# Patient Record
Sex: Male | Born: 1975 | Race: White | Hispanic: No | Marital: Married | Smoking: Never smoker
Health system: Southern US, Community
[De-identification: ages and names within clinical notes are randomized; demographics above are authoritative.]

## PROBLEM LIST (undated history)

## (undated) DIAGNOSIS — I10 Essential (primary) hypertension: Secondary | ICD-10-CM

---

## 2008-02-02 ENCOUNTER — Ambulatory Visit: Payer: Self-pay | Admitting: Family Medicine

## 2015-12-10 ENCOUNTER — Encounter: Payer: Self-pay | Admitting: *Deleted

## 2015-12-13 ENCOUNTER — Ambulatory Visit: Payer: Self-pay | Admitting: Cardiology

## 2016-09-27 ENCOUNTER — Ambulatory Visit
Admission: EM | Admit: 2016-09-27 | Discharge: 2016-09-27 | Disposition: A | Discharge status: Home / Self Care | Payer: 59 | Attending: Family Medicine | Admitting: Family Medicine

## 2016-09-27 ENCOUNTER — Encounter: Payer: Self-pay | Admitting: Emergency Medicine

## 2016-09-27 DIAGNOSIS — A084 Viral intestinal infection, unspecified: Secondary | ICD-10-CM

## 2016-09-27 LAB — CBC WITH DIFFERENTIAL/PLATELET
Basophils Absolute: 0.1 10*3/uL (ref 0–0.1)
Basophils Relative: 0 %
EOS PCT: 0 %
Eosinophils Absolute: 0 10*3/uL (ref 0–0.7)
HEMATOCRIT: 44.5 % (ref 40.0–52.0)
Hemoglobin: 15.7 g/dL (ref 13.0–18.0)
LYMPHS ABS: 0.4 10*3/uL — AB (ref 1.0–3.6)
LYMPHS PCT: 3 %
MCH: 32.2 pg (ref 26.0–34.0)
MCHC: 35.2 g/dL (ref 32.0–36.0)
MCV: 91.4 fL (ref 80.0–100.0)
MONO ABS: 0.4 10*3/uL (ref 0.2–1.0)
MONOS PCT: 3 %
NEUTROS ABS: 11.6 10*3/uL — AB (ref 1.4–6.5)
Neutrophils Relative %: 94 %
PLATELETS: 152 10*3/uL (ref 150–440)
RBC: 4.87 MIL/uL (ref 4.40–5.90)
RDW: 12.9 % (ref 11.5–14.5)
WBC: 12.4 10*3/uL — ABNORMAL HIGH (ref 3.8–10.6)

## 2016-09-27 LAB — COMPREHENSIVE METABOLIC PANEL
ALT: 42 U/L (ref 17–63)
AST: 38 U/L (ref 15–41)
Albumin: 4.5 g/dL (ref 3.5–5.0)
Alkaline Phosphatase: 64 U/L (ref 38–126)
Anion gap: 10 (ref 5–15)
BILIRUBIN TOTAL: 0.8 mg/dL (ref 0.3–1.2)
BUN: 11 mg/dL (ref 6–20)
CO2: 22 mmol/L (ref 22–32)
CREATININE: 1.17 mg/dL (ref 0.61–1.24)
Calcium: 9.3 mg/dL (ref 8.9–10.3)
Chloride: 101 mmol/L (ref 101–111)
Glucose, Bld: 136 mg/dL — ABNORMAL HIGH (ref 65–99)
POTASSIUM: 4 mmol/L (ref 3.5–5.1)
Sodium: 133 mmol/L — ABNORMAL LOW (ref 135–145)
TOTAL PROTEIN: 8.3 g/dL — AB (ref 6.5–8.1)

## 2016-09-27 MED ORDER — ONDANSETRON 8 MG PO TBDP: 8.0000 mg | ORAL_TABLET | Freq: Three times a day (TID) | ORAL | 0 refills | Status: AC | PRN

## 2016-09-27 MED ORDER — ONDANSETRON 8 MG PO TBDP
8.0000 mg | ORAL_TABLET | Freq: Once | ORAL | Status: AC
  Administered 2016-09-27: 8 mg via ORAL

## 2016-09-27 NOTE — ED Provider Notes (Signed)
[facility]    CSN: [id number] Arrival date & time: 09/27/16  [id number]     History   Chief Complaint Chief Complaint  Patient presents with  . Diarrhea  . Abdominal Pain  . Generalized Body Aches    HPI Craig Clarke is a 41 y.o. male.   The history is provided by the patient.  Diarrhea  Quality:  Watery Severity:  Moderate Onset quality:  Sudden Number of episodes:  5 Duration:  1 day Timing:  Constant Progression:  Worsening Relieved by:  Nothing Worsened by:  Nothing Associated symptoms: abdominal pain ("cramping", mild), headaches and myalgias   Associated symptoms: no arthralgias, no chills, no recent cough, no diaphoresis, no fever, no URI and no vomiting   Associated symptoms comment:  Nausea Risk factors: no recent antibiotic use, no sick contacts, no suspicious food intake and no travel to endemic areas     History reviewed. No pertinent past medical history.  There are no active problems to display for this patient.   History reviewed. No pertinent surgical history.     Home Medications    Prior to Admission medications   Medication Sig Start Date End Date Taking? Authorizing Provider  ondansetron (ZOFRAN ODT) 8 MG disintegrating tablet Take 1 tablet (8 mg total) by mouth every 8 (eight) hours as needed for nausea or vomiting. 09/27/16   Payton Mccallumonty, Lorina Duffner, MD    Family History History reviewed. No pertinent family history.  Social History Social History  Substance Use Topics  . Smoking status: Never Smoker  . Smokeless tobacco: Never Used  . Alcohol use Yes     Allergies   Patient has no known allergies.   Review of Systems Review of Systems  Constitutional: Negative for chills, diaphoresis and fever.  Gastrointestinal: Positive for abdominal pain ("cramping", mild) and diarrhea. Negative for vomiting.  Musculoskeletal: Positive for myalgias. Negative for arthralgias.  Neurological: Positive for headaches.     Physical  Exam Triage Vital Signs ED Triage Vitals [09/27/16 0901]  Enc Vitals Group     BP (!) 146/87     Pulse Rate (!) 121     Resp 16     Temp (!) 101.9 F (38.8 C)     Temp Source Oral     SpO2 97 %     Weight 215 lb (97.5 kg)     Height 5\' 7"  (1.702 m)     Head Circumference      Peak Flow      Pain Score 7     Pain Loc      Pain Edu?      Excl. in GC?    No data found.   Updated Vital Signs BP 139/85 (BP Location: Left Arm)   Pulse (!) 113   Temp (!) 102.4 F (39.1 C) (Oral)   Resp 16   Ht 5\' 7"  (1.702 m)   Wt 215 lb (97.5 kg)   SpO2 97%   BMI 33.67 kg/m   Visual Acuity Right Eye Distance:   Left Eye Distance:   Bilateral Distance:    Right Eye Near:   Left Eye Near:    Bilateral Near:     Physical Exam  Constitutional: He is oriented to person, place, and time. He appears well-developed and well-nourished. No distress.  HENT:  Head: Normocephalic and atraumatic.  Cardiovascular: Normal rate, regular rhythm, normal heart sounds and intact distal pulses.   No murmur heard. Pulmonary/Chest: Effort normal and breath sounds normal. No  respiratory distress. He has no wheezes. He has no rales.  Abdominal: Soft. Bowel sounds are normal. He exhibits no distension and no mass. There is tenderness (mild, diffuse, no rebound or guarding). There is no rebound and no guarding.  Neurological: He is alert and oriented to person, place, and time.  Skin: No rash noted. He is not diaphoretic.  Nursing note and vitals reviewed.    UC Treatments / Results  Labs (all labs ordered are listed, but only abnormal results are displayed) Labs Reviewed  CBC WITH DIFFERENTIAL/PLATELET - Abnormal; Notable for the following:       Result Value   WBC 12.4 (*)    Neutro Abs 11.6 (*)    Lymphs Abs 0.4 (*)    All other components within normal limits  COMPREHENSIVE METABOLIC PANEL - Abnormal; Notable for the following:    Sodium 133 (*)    Glucose, Bld 136 (*)    Total Protein 8.3  (*)    All other components within normal limits    EKG  EKG Interpretation None       Radiology No results found.  Procedures Procedures (including critical care time)  Medications Ordered in UC Medications  ondansetron (ZOFRAN-ODT) disintegrating tablet 8 mg (8 mg Oral Given 09/27/16 0932)     Initial Impression / Assessment and Plan / UC Course  I have reviewed the triage vital signs and the nursing notes.  Pertinent labs & imaging results that were available during my care of the patient were reviewed by me and considered in my medical decision making (see chart for details).       Final Clinical Impressions(s) / UC Diagnoses   Final diagnoses:  Viral gastroenteritis    New Prescriptions Discharge Medication List as of 09/27/2016 10:34 AM    START taking these medications   Details  ondansetron (ZOFRAN ODT) 8 MG disintegrating tablet Take 1 tablet (8 mg total) by mouth every 8 (eight) hours as needed for nausea or vomiting., Starting Wed 09/27/2016, Normal       1. Lab results and diagnosis reviewed with patient; patient given zofran 8mg  odt x1 with improvement of symptoms; tolerating po fluids prior to d/c 2. rx as per orders above; reviewed possible side effects, interactions, risks and benefits  3. Recommend supportive treatment with increased fluids, otc imodium 4. Follow-up prn if symptoms worsen or don't improve   Payton Mccallum, MD 09/27/16 1252

## 2016-09-27 NOTE — ED Notes (Signed)
Patient given ginger ale to drink.  Patient states that he does not want any medicine for his fever.

## 2016-09-27 NOTE — ED Triage Notes (Signed)
Patient c/o diarrhea that started yesterday.  Patient reports bodyaches and headaches.  Patient denies vomiting.  Patient reports mid abdominal stomach cramps.

## 2016-09-27 NOTE — Discharge Instructions (Signed)
Increase clear liquids next 24-48 hours (gatorade, pedialyte, broth, light soup, water, soda) Imodium AD as needed for diarrhea

## 2016-09-28 ENCOUNTER — Other Ambulatory Visit
Admission: RE | Admit: 2016-09-28 | Discharge: 2016-09-28 | Disposition: A | Discharge status: Home / Self Care | Payer: 59 | Source: Ambulatory Visit | Attending: Family Medicine | Admitting: Family Medicine

## 2016-09-28 DIAGNOSIS — K529 Noninfective gastroenteritis and colitis, unspecified: Secondary | ICD-10-CM | POA: Insufficient documentation

## 2016-09-28 LAB — GASTROINTESTINAL PANEL BY PCR, STOOL (REPLACES STOOL CULTURE)
ASTROVIRUS: NOT DETECTED
Adenovirus F40/41: NOT DETECTED
CAMPYLOBACTER SPECIES: DETECTED — AB
Cryptosporidium: NOT DETECTED
Cyclospora cayetanensis: NOT DETECTED
Entamoeba histolytica: NOT DETECTED
Enteroaggregative E coli (EAEC): NOT DETECTED
Enteropathogenic E coli (EPEC): NOT DETECTED
Enterotoxigenic E coli (ETEC): NOT DETECTED
Giardia lamblia: NOT DETECTED
Norovirus GI/GII: NOT DETECTED
PLESIMONAS SHIGELLOIDES: NOT DETECTED
ROTAVIRUS A: NOT DETECTED
SAPOVIRUS (I, II, IV, AND V): NOT DETECTED
SHIGA LIKE TOXIN PRODUCING E COLI (STEC): NOT DETECTED
Salmonella species: NOT DETECTED
Shigella/Enteroinvasive E coli (EIEC): NOT DETECTED
VIBRIO CHOLERAE: NOT DETECTED
Vibrio species: NOT DETECTED
Yersinia enterocolitica: NOT DETECTED

## 2016-09-29 ENCOUNTER — Telehealth: Payer: Self-pay | Admitting: Internal Medicine

## 2016-09-29 MED ORDER — CIPROFLOXACIN HCL 500 MG PO TABS: 500.0000 mg | ORAL_TABLET | Freq: Two times a day (BID) | ORAL | 0 refills | Status: AC

## 2016-09-29 NOTE — Telephone Encounter (Signed)
Received lab result: stool sample positive for campylobacter.  Will send rx for cipro to pharmacy of record, [facility] in [place].  Recheck for further evaluation if symptoms are not improving.  LM

## 2016-10-02 ENCOUNTER — Telehealth: Payer: Self-pay | Admitting: Emergency Medicine

## 2016-10-02 NOTE — Telephone Encounter (Signed)
The Communicable disease form faxed to [facility].

## 2016-10-02 NOTE — Telephone Encounter (Signed)
Patient notified that his stool specimen did come back positive for Campylobacter and that Dr. Dayton ScrapeMurray sent a prescription for Cipro to his pharmacy to treat this infection.  Patient states that he started that antibiotic on Friday and is feeling so much better.  Patient instructed to continue with the Cipro and to follow-up here or with his PCP if his symptoms return or worsen.  Patient verbalized understanding.

## 2020-12-26 ENCOUNTER — Ambulatory Visit (INDEPENDENT_AMBULATORY_CARE_PROVIDER_SITE_OTHER): Payer: BC Managed Care – PPO

## 2020-12-26 ENCOUNTER — Ambulatory Visit
Admission: EM | Admit: 2020-12-26 | Discharge: 2020-12-26 | Disposition: A | Discharge status: Home / Self Care | Payer: BC Managed Care – PPO | Attending: Emergency Medicine | Admitting: Emergency Medicine

## 2020-12-26 ENCOUNTER — Other Ambulatory Visit: Payer: Self-pay

## 2020-12-26 DIAGNOSIS — S2242XA Multiple fractures of ribs, left side, initial encounter for closed fracture: Secondary | ICD-10-CM | POA: Diagnosis present

## 2020-12-26 DIAGNOSIS — S301XXA Contusion of abdominal wall, initial encounter: Secondary | ICD-10-CM | POA: Diagnosis present

## 2020-12-26 DIAGNOSIS — R0781 Pleurodynia: Secondary | ICD-10-CM | POA: Diagnosis not present

## 2020-12-26 HISTORY — DX: Essential (primary) hypertension: I10

## 2020-12-26 LAB — POCT URINALYSIS DIP (DEVICE)
Bilirubin Urine: NEGATIVE
Glucose, UA: NEGATIVE mg/dL
Hgb urine dipstick: NEGATIVE
Ketones, ur: NEGATIVE mg/dL
Nitrite: NEGATIVE
Protein, ur: NEGATIVE mg/dL
Specific Gravity, Urine: 1.02 (ref 1.005–1.030)
Urobilinogen, UA: 0.2 mg/dL (ref 0.0–1.0)
pH: 6.5 (ref 5.0–8.0)

## 2020-12-26 MED ORDER — BACLOFEN 10 MG PO TABS: 10.0000 mg | ORAL_TABLET | Freq: Three times a day (TID) | ORAL | 0 refills | Status: AC

## 2020-12-26 MED ORDER — HYDROCODONE-ACETAMINOPHEN 5-325 MG PO TABS: 1.0000 | ORAL_TABLET | Freq: Four times a day (QID) | ORAL | 0 refills | Status: AC | PRN
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
## 2020-12-26 NOTE — Discharge Instructions (Addendum)
Use Tylenol and ibuprofen according to package instructions as needed for mild to moderate pain.  Use the Norco as needed for severe pain.  You may have 1 to 2 tablets every 6 hours.  This medication also contains Tylenol so keep track of how much Tylenol you are taking and do not exceed 4000 mg a day.  Use the baclofen every 8 hours as needed for muscle spasm.  Take 10 deep breaths per hour to keep your lungs well-inflated and prevent the development of pneumonia.  You can apply a lidocaine patch topically to the area of pain as well.  Return for reevaluation for new or worsening symptoms.
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
## 2023-02-18 IMAGING — CR DG RIBS W/ CHEST 3+V*L*
3 series · 3 of 3 positions shown · non-contrast
Comparison: None.

CLINICAL DATA: Left rib pain after injury last night.

EXAM:
LEFT RIBS AND CHEST - 3+ VIEW

[chest pa]
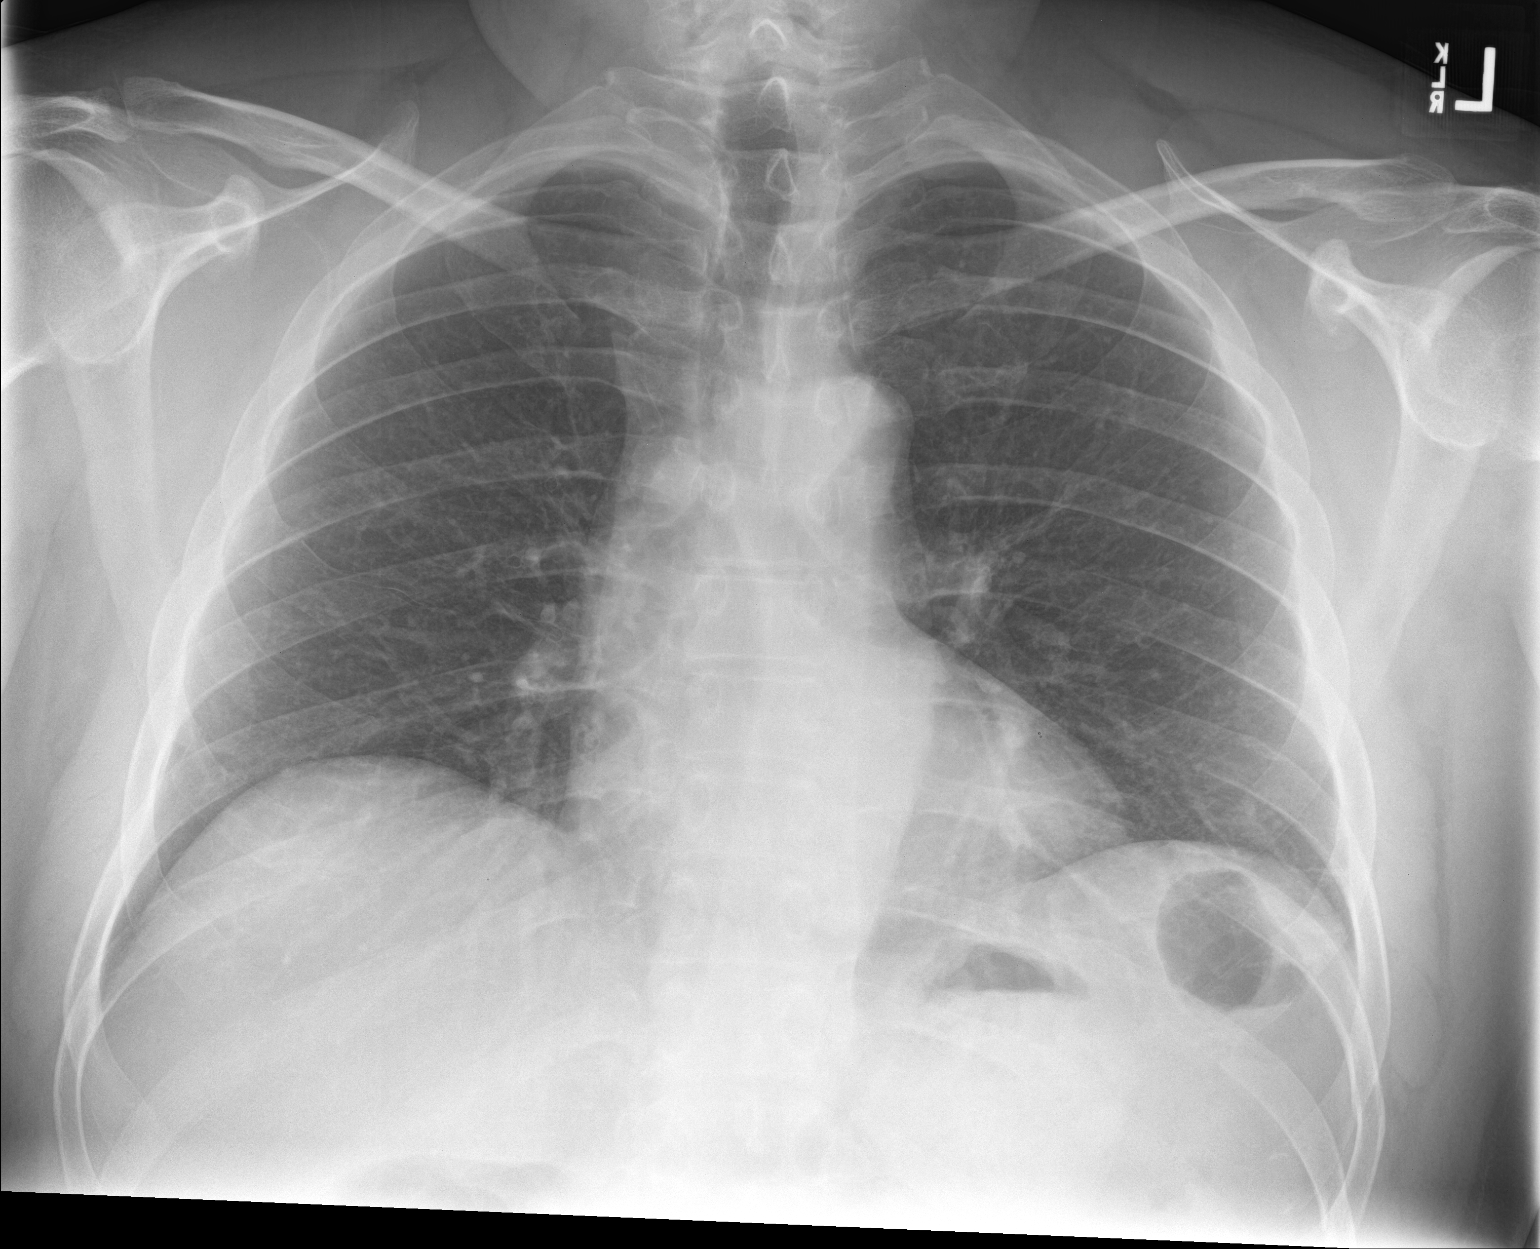

[rib pa]
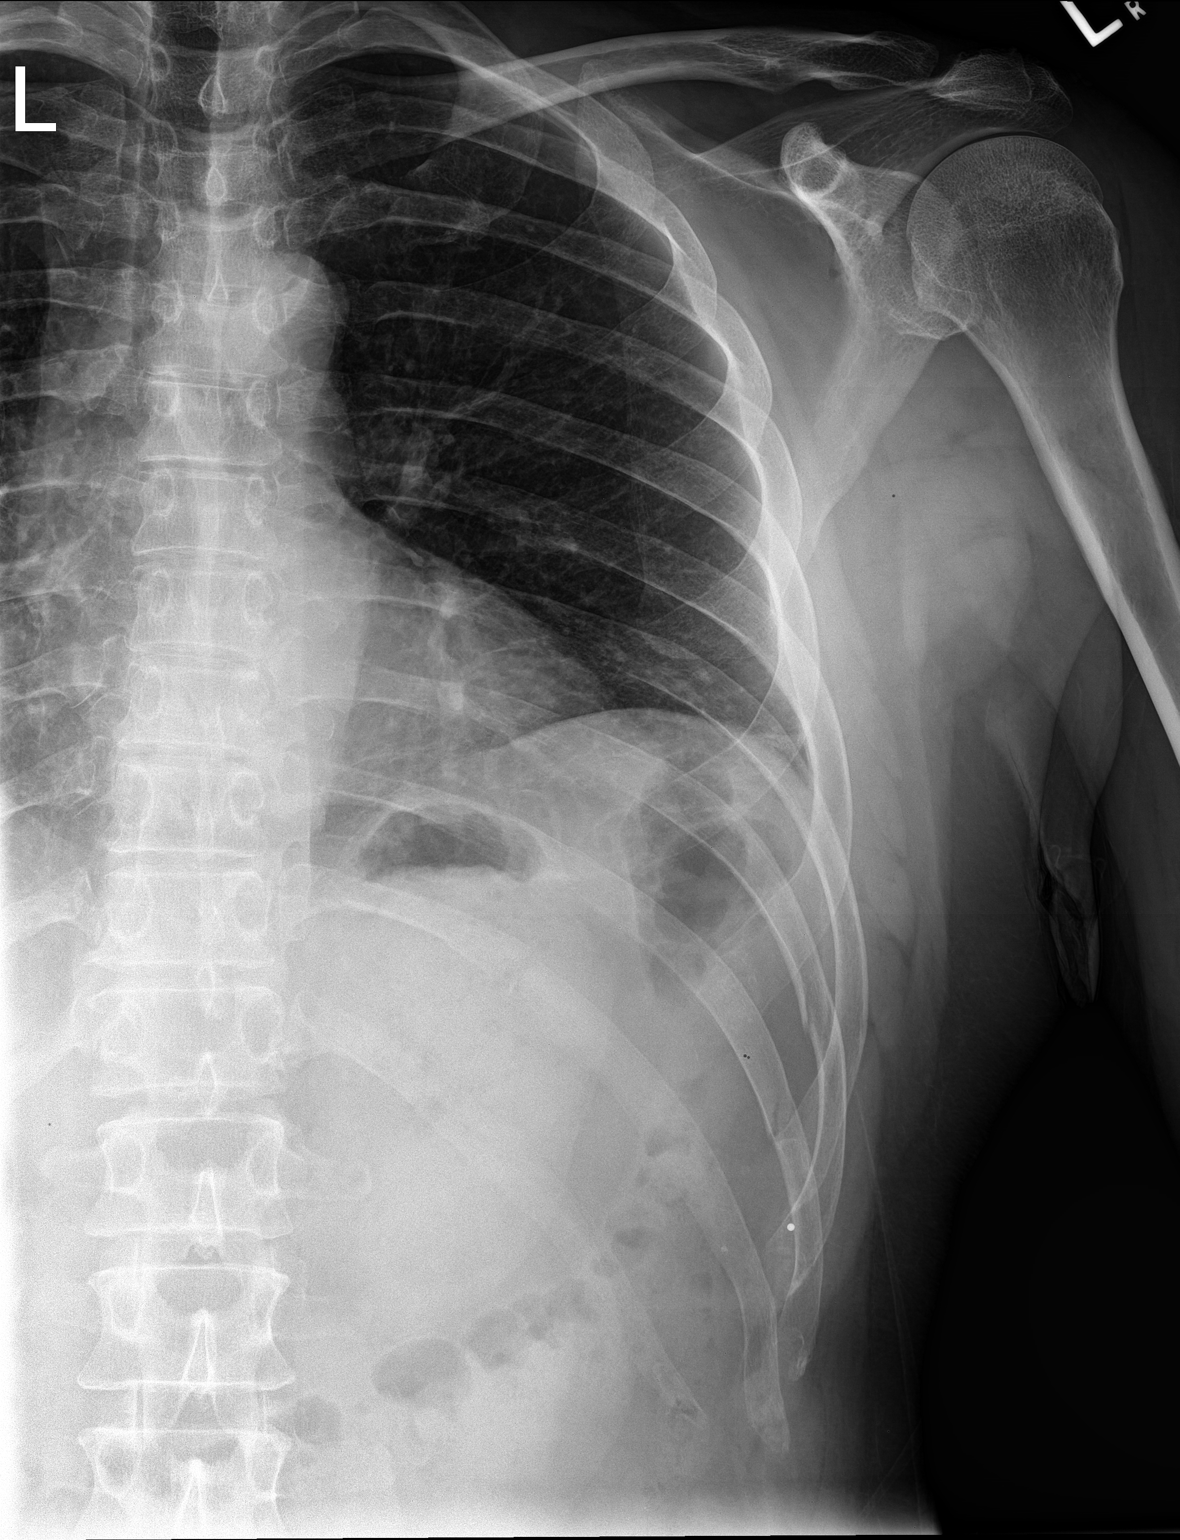

[rib obl]
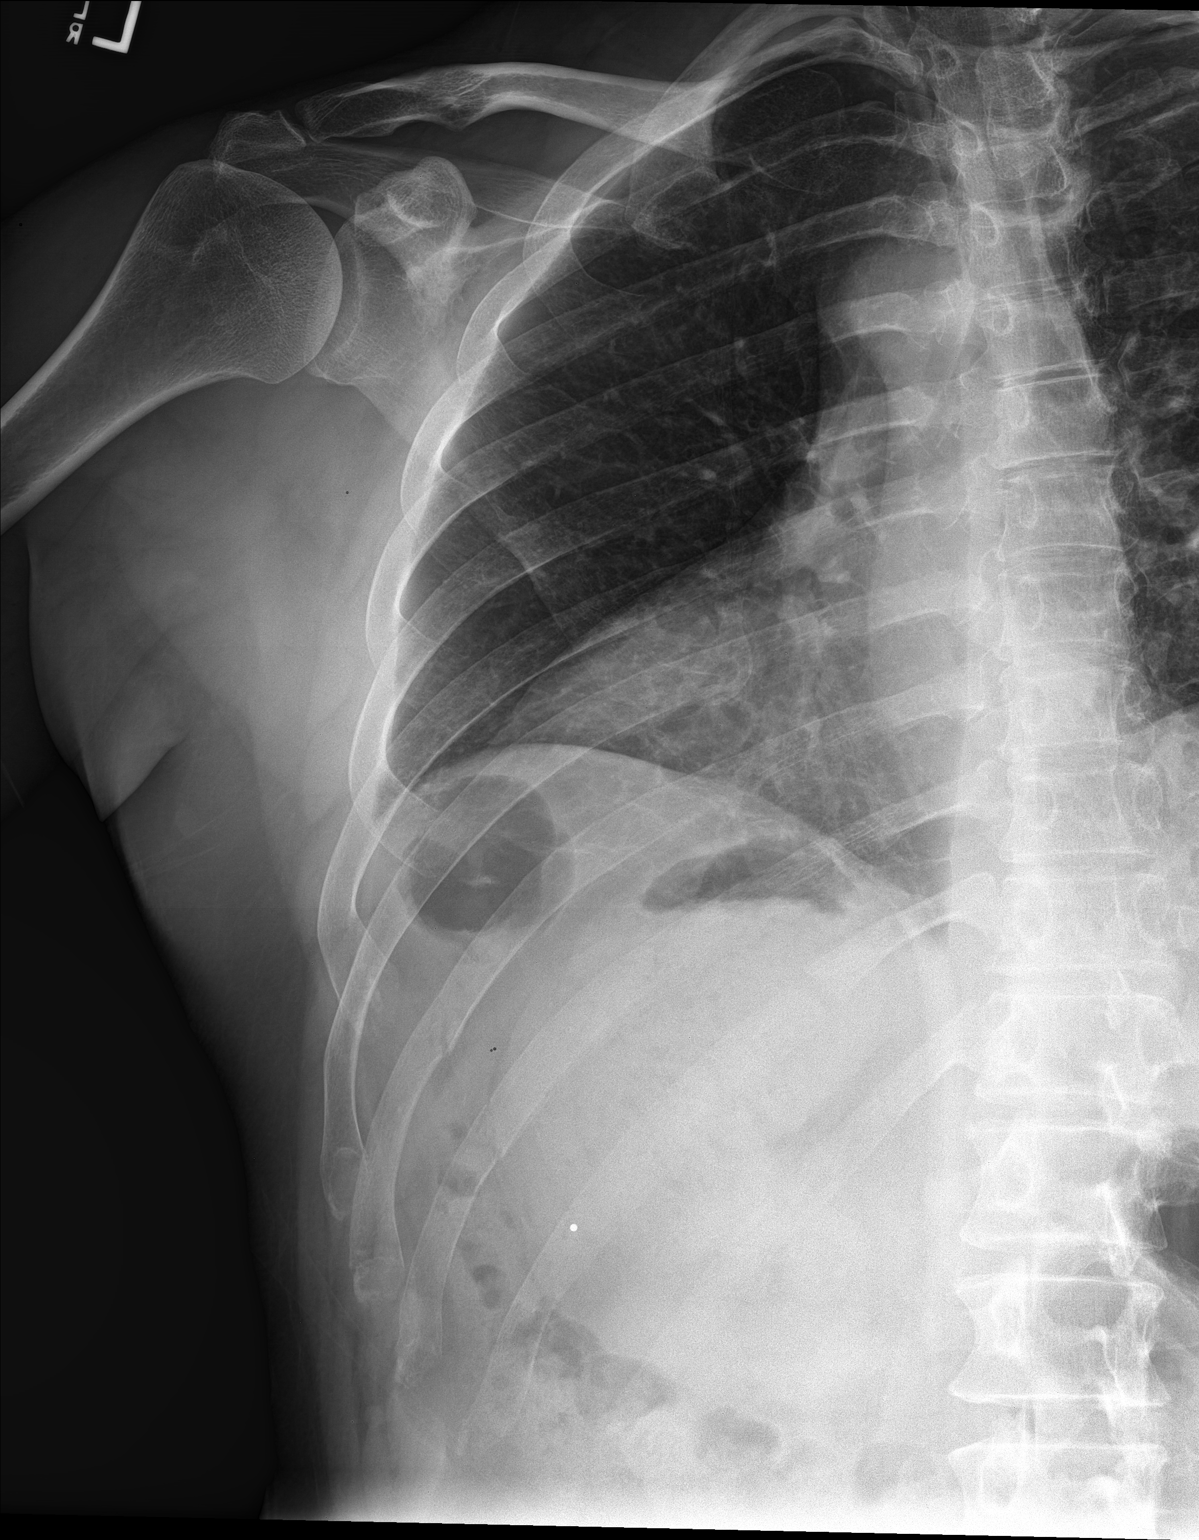

[3 of 3 positions shown; findings below may reference images not displayed]

FINDINGS: Mildly displaced fractures are seen involving the anterior portions
of the left ninth and tenth ribs. There is no evidence of
pneumothorax or pleural effusion. Both lungs are clear. Heart size
and mediastinal contours are within normal limits.
IMPRESSION: Mildly displaced left ninth and tenth rib fractures.
